# Patient Record
Sex: Female | Born: 2000 | Race: White | Hispanic: No | Marital: Single | Smoking: Never smoker
Health system: Southern US, Community
[De-identification: ages and names within clinical notes are randomized; demographics above are authoritative.]

## PROBLEM LIST (undated history)

## (undated) DIAGNOSIS — J45909 Unspecified asthma, uncomplicated: Secondary | ICD-10-CM

## (undated) DIAGNOSIS — F419 Anxiety disorder, unspecified: Secondary | ICD-10-CM

## (undated) DIAGNOSIS — R011 Cardiac murmur, unspecified: Secondary | ICD-10-CM

## (undated) HISTORY — DX: Unspecified asthma, uncomplicated: J45.909

## (undated) HISTORY — DX: Cardiac murmur, unspecified: R01.1

## (undated) HISTORY — DX: Anxiety disorder, unspecified: F41.9

---
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
## 2018-10-13 ENCOUNTER — Other Ambulatory Visit: Payer: Self-pay | Admitting: Sports Medicine

## 2018-10-13 ENCOUNTER — Ambulatory Visit: Payer: Managed Care, Other (non HMO) | Admitting: Sports Medicine

## 2018-10-13 DIAGNOSIS — R21 Rash and other nonspecific skin eruption: Secondary | ICD-10-CM | POA: Insufficient documentation

## 2018-10-13 DIAGNOSIS — Z0000 Encounter for general adult medical examination without abnormal findings: Secondary | ICD-10-CM | POA: Insufficient documentation

## 2018-10-13 DIAGNOSIS — Z23 Encounter for immunization: Secondary | ICD-10-CM | POA: Diagnosis not present

## 2018-10-13 MED ORDER — TRI-LO-MARZIA 0.18/0.215/0.25 MG-25 MCG PO TABS: 1.0000 | ORAL_TABLET | Freq: Every day | ORAL | 4 refills | Status: DC

## 2018-10-13 MED ORDER — TRIAMCINOLONE ACETONIDE 0.05 % EX OINT: 1.0000 "application " | TOPICAL_OINTMENT | Freq: Two times a day (BID) | CUTANEOUS | 0 refills | Status: DC

## 2018-10-13 MED ORDER — TRI-LO-MARZIA 0.18/0.215/0.25 MG-25 MCG PO TABS: 1.0000 | ORAL_TABLET | Freq: Every day | ORAL | 11 refills | Status: DC

## 2018-10-13 NOTE — Assessment & Plan Note (Signed)
New patient physical. Flu shot, meningococcal B. Return in greater than 30 days for vaccine #2 for meningococcal B. Checking routine labs.

## 2018-10-13 NOTE — Assessment & Plan Note (Signed)
Mild acne vulgaris, we can try BenzaClin for this in the future should she desire. Principal issue is a lichenified rash over the right side of the face, adding a low potency triamcinolone.

## 2018-10-13 NOTE — Progress Notes (Signed)
Subjective:    CC: New patient visit with the below complaints as noted in HPI:  HPI:  This is a pleasant 17 year old female, she is transferring over from her pediatrician.  She is healthy, she did have some anxiety growing up but symptoms are well controlled without medications.  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  More importantly to her though, she has developed a rash over the right side of her mouth, no changes in creams, foods, no particulate instigating factors.  Rash is only minimally itchy.  Otherwise has no complaints.  I reviewed the past medical history, family history, social history, surgical history, and allergies today and no changes were needed.  Please see the problem list section below in epic for further details.  Past Medical History: Past Medical History:  Diagnosis Date  . Anxiety   . Asthma   . Heart murmur    Past Surgical History: No past surgical history on file. Social History: Social History   Socioeconomic History  . Marital status: Single    Spouse name: Not on file  . Number of children: Not on file  . Years of education: Not on file  . Highest education level: Not on file  Occupational History  . Not on file  Social Needs  . Financial resource strain: Not on file  . Food insecurity:    Worry: Not on file    Inability: Not on file  . Transportation needs:    Medical: Not on file    Non-medical: Not on file  Tobacco Use  . Smoking status: Never Smoker  . Smokeless tobacco: Never Used  Substance and Sexual Activity  . Alcohol use: No  . Drug use: No  . Sexual activity: Never  Lifestyle  . Physical activity:    Days per week: Not on file    Minutes per session: Not on file  . Stress: Not on file  Relationships  . Social connections:    Talks on phone: Not on file    Gets together: Not on file    Attends religious service: Not on file    Active member of club or  organization: Not on file    Attends meetings of clubs or organizations: Not on file    Relationship status: Not on file  Other Topics Concern  . Not on file  Social History Narrative  . Not on file   Family History: Family History  Problem Relation Age of Onset  . Breast cancer Other   . Diabetes Paternal Uncle   . Depression Paternal Uncle   . Cancer Maternal Grandmother   . Mitral valve prolapse Maternal Grandmother   . Diabetes Maternal Grandmother   . Heart disease Maternal Grandfather   . Diabetes Paternal Grandmother   . Mitral valve prolapse Father   . Bipolar disorder Father   . Depression Mother   . Febrile seizures Brother        Resolved  . Migraines Maternal Aunt   . Breast cancer Paternal Aunt   . Seizures Paternal Aunt   . Migraines Cousin    Allergies: Allergies  Allergen Reactions  . Other Anaphylaxis    Tree Nuts- Anaphylaxis Seasonal Allergies    Medications: See med rec.  Review of Systems: No headache, visual changes, nausea, vomiting, diarrhea, constipation, dizziness, abdominal pain, skin rash, fevers, chills, night sweats, swollen lymph nodes, weight loss, chest pain, body aches, joint swelling, muscle aches, shortness  of breath, mood changes, visual or auditory hallucinations.  Objective:    General: Well Developed, well nourished, and in no acute distress.  Neuro: Alert and oriented x3, extra-ocular muscles intact, sensation grossly intact. Cranial nerves II through XII are intact, motor, sensory, and coordinative functions are all intact. HEENT: Normocephalic, atraumatic, pupils equal round reactive to light, neck supple, no masses, no lymphadenopathy, thyroid nonpalpable. Oropharynx, nasopharynx, external ear canals are unremarkable. Skin: Warm and dry, there is a slightly reddish lichenified rash just outside the lips on the right side of the mouth.  Mild papular acne vulgaris on the cheeks and forehead. Cardiac: Regular rate and rhythm, no  murmurs rubs or gallops.  Respiratory: Clear to auscultation bilaterally. Not using accessory muscles, speaking in full sentences.  Abdominal: Soft, nontender, nondistended, positive bowel sounds, no masses, no organomegaly.  Musculoskeletal: Shoulder, elbow, wrist, hip, knee, ankle stable, and with full range of motion.  Impression and Recommendations:    The patient was counselled, risk factors were discussed, anticipatory guidance given.  Annual physical exam New patient physical. Flu shot, meningococcal B. Return in greater than 30 days for vaccine #2 for meningococcal B. Checking routine labs.   Skin rash Mild acne vulgaris, we can try BenzaClin for this in the future should she desire. Principal issue is a lichenified rash over the right side of the face, adding a low potency triamcinolone. __________________________________________ Ihor Austin. Benjamin Stain, M.D., ABFM., CAQSM. Primary Care and Sports Medicine [facility]  Adjunct Professor of Family Medicine  [employer]

## 2018-10-14 ENCOUNTER — Telehealth: Payer: Self-pay

## 2018-10-14 MED ORDER — TRIAMCINOLONE ACETONIDE 0.025 % EX OINT: 1.0000 "application " | TOPICAL_OINTMENT | Freq: Two times a day (BID) | CUTANEOUS | 0 refills | Status: DC

## 2018-10-14 NOTE — Telephone Encounter (Signed)
Done

## 2018-10-14 NOTE — Telephone Encounter (Signed)
[facility] called and states the Ointment only comes in the Brand name and the cost is over $1000. They did suggest Triamcinolone 0.025 %. Please advise.

## 2018-10-15 LAB — COMPREHENSIVE METABOLIC PANEL
AG Ratio: 1.6 (calc) (ref 1.0–2.5)
ALT: 14 U/L (ref 8–46)
AST: 15 U/L (ref 12–32)
Albumin: 4.1 g/dL (ref 3.6–5.1)
BUN: 12 mg/dL (ref 7–20)
Creat: 0.82 mg/dL (ref 0.60–1.26)
Glucose, Bld: 85 mg/dL (ref 65–99)
Potassium: 4.3 mmol/L (ref 3.8–5.1)
Sodium: 142 mmol/L (ref 135–146)
Total Bilirubin: 0.4 mg/dL (ref 0.2–1.1)
Total Protein: 6.6 g/dL (ref 6.3–8.2)

## 2018-10-15 LAB — VITAMIN D 25 HYDROXY (VIT D DEFICIENCY, FRACTURES): Vit D, 25-Hydroxy: 42 ng/mL (ref 30–100)

## 2018-10-15 LAB — TSH: TSH: 1.83 m[IU]/L (ref 0.50–4.30)

## 2018-10-15 LAB — LIPID PANEL W/REFLEX DIRECT LDL
Cholesterol: 186 mg/dL — ABNORMAL HIGH (ref <170)
HDL: 70 mg/dL (ref >45)
LDL Cholesterol (Calc): 99 mg/dL (calc) (ref <110)
Non-HDL Cholesterol (Calc): 116 mg/dL (ref <120)
Total CHOL/HDL Ratio: 2.7 (calc) (ref <5.0)
Triglycerides: 81 mg/dL (ref <90)

## 2018-10-15 LAB — CBC
HCT: 36.4 % (ref 36.0–49.0)
Hemoglobin: 12.4 g/dL (ref 12.0–16.9)
MCH: 28.8 pg (ref 25.0–35.0)
MCHC: 34.1 g/dL (ref 31.0–36.0)
MCV: 84.5 fL (ref 78.0–98.0)
MPV: 10.3 fL (ref 7.5–12.5)
Platelets: 325 10*3/uL (ref 140–400)
RBC: 4.31 Million/uL (ref 4.10–5.70)
RDW: 12.2 % (ref 11.0–15.0)
WBC: 9.7 10*3/uL (ref 4.5–13.0)

## 2018-10-15 LAB — COMPREHENSIVE METABOLIC PANEL WITH GFR
Alkaline phosphatase (APISO): 43 U/L — ABNORMAL LOW (ref 48–230)
CO2: 28 mmol/L (ref 20–32)
Calcium: 9.3 mg/dL (ref 8.9–10.4)
Chloride: 105 mmol/L (ref 98–110)
Globulin: 2.5 g/dL (ref 2.1–3.5)

## 2018-10-15 LAB — HEMOGLOBIN A1C
Hgb A1c MFr Bld: 5.1 %{Hb} (ref <5.7)
Mean Plasma Glucose: 100 (calc)
eAG (mmol/L): 5.5 (calc)

## 2018-10-15 LAB — HIV ANTIBODY (ROUTINE TESTING W REFLEX): HIV 1&2 Ab, 4th Generation: NONREACTIVE

## 2018-11-23 ENCOUNTER — Ambulatory Visit: Payer: Managed Care, Other (non HMO) | Admitting: Family Medicine

## 2018-11-23 VITALS — BP 118/71 | HR 65 | Ht 64.0 in | Wt 131.0 lb

## 2018-11-23 DIAGNOSIS — M79672 Pain in left foot: Secondary | ICD-10-CM | POA: Diagnosis not present

## 2018-11-23 DIAGNOSIS — Z23 Encounter for immunization: Secondary | ICD-10-CM

## 2018-11-23 MED ORDER — DICLOFENAC SODIUM 1 % TD GEL: 2.0000 g | Freq: Four times a day (QID) | TRANSDERMAL | 11 refills | Status: DC

## 2018-11-23 NOTE — Progress Notes (Signed)
Brittany Sheppard is a 17 y.o. female who presents to [facility] today for left lateral foot pain.  Patient notes a 6 to 7-day history of left lateral foot pain.  Pain is located at the proximal fifth metatarsal into the cuboid and forward part of the calcaneus.  She denies any injury or significant change in activity.  She notes contralateral right sided symptoms are also present but very mild.  She is not really tried much treatment yet.  No radiating pain weakness or numbness fevers or chills.  She feels well otherwise.  She notes she goes to school at [employer] and notes that she has to do lots of walking but that has not changed recently.    ROS:  As above  Exam:  BP 118/71   Pulse 65   Ht 5\' 4"  (1.626 m)   Wt 131 lb (59.4 kg)   BMI 22.49 kg/m  General: Well Developed, well nourished, and in no acute distress.  Neuro/Psych: Alert and oriented x3, extra-ocular muscles intact, able to move all 4 extremities, sensation grossly intact. Skin: Warm and dry, no rashes noted.  Respiratory: Not using accessory muscles, speaking in full sentences, trachea midline.  Cardiovascular: Pulses palpable, no extremity edema. Abdomen: Does not appear distended. MSK: Left foot relatively normal-appearing with no deformity. Normal foot and ankle motion. Minimally tender to palpation proximal fifth metatarsal and into the peroneal tendon area. Strength is intact.  Resisted foot eversion minimally tender and painful.  Left foot normal.  No deformity. Normal foot and ankle motion. Nontender. Normal strength.  Pulses capillary fill and sensation are intact bilateral lower extremities.       Assessment and Plan: 17 y.o. female with lateral foot pain.  Unclear etiology.  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  Plan for diclofenac gel and eccentric exercises.  Also recommend compressive sleeve.  If not improving recheck neck  step likely would be x-ray and potential MRI.  Additionally patient due for second in a series of 2 meningitis B vaccines.  This was given today.    Orders Placed This Encounter  Procedures  . Meningococcal B, OMV (Bexsero)   Meds ordered this encounter  Medications  . diclofenac sodium (VOLTAREN) 1 % GEL    Sig: Apply 2 g topically 4 (four) times daily. To affected joint.    Dispense:  100 g    Refill:  11    Historical information moved to improve visibility of documentation.  Past Medical History:  Diagnosis Date  . Anxiety   . Asthma   . Heart murmur    No past surgical history on file. Social History   Tobacco Use  . Smoking status: Never Smoker  . Smokeless tobacco: Never Used  Substance Use Topics  . Alcohol use: No   family history includes Bipolar disorder in her father; Breast cancer in her other and paternal aunt; Cancer in her maternal grandmother; Depression in her mother and paternal uncle; Diabetes in her maternal grandmother, paternal grandmother, and paternal uncle; Febrile seizures in her brother; Heart disease in her maternal grandfather; Migraines in her cousin and maternal aunt; Mitral valve prolapse in her father and maternal grandmother; Seizures in her paternal aunt.  Medications: Current Outpatient Medications  Medication Sig Dispense Refill  . TRI-LO-MARZIA 0.18/0.215/0.25 MG-25 MCG tab Take 1 tablet by mouth daily. 3 Package 4  . triamcinolone (KENALOG) 0.025 % ointment Apply 1 application topically 2 (two) times  daily. 30 g 0  . diclofenac sodium (VOLTAREN) 1 % GEL Apply 2 g topically 4 (four) times daily. To affected joint. 100 g 11   No current facility-administered medications for this visit.    Allergies  Allergen Reactions  . Other Anaphylaxis    Tree Nuts- Anaphylaxis Seasonal Allergies       Discussed warning signs or symptoms. Please see discharge instructions. Patient expresses understanding.

## 2018-11-23 NOTE — Patient Instructions (Signed)
Thank you for coming in today. Apply the diclofenac gel to the lateral foot up to 4x daily as needed for pain.  Do the band exercises about 30 reps 2x daily.  Consider compression sleeve.  Body helix Full Ankle.   Recheck if not improving.    Peroneal Tendinopathy Rehab Ask your health care provider which exercises are safe for you. Do exercises exactly as told by your health care provider and adjust them as directed. It is normal to feel mild stretching, pulling, tightness, or discomfort as you do these exercises, but you should stop right away if you feel sudden pain or your pain gets worse.Do not begin these exercises until told by your health care provider. Stretching and range of motion exercises These exercises warm up your muscles and joints and improve the movement and flexibility of your ankle. These exercises also help to relieve pain and stiffness. Exercise A: Gastroc and soleus, standing 1. Stand on the edge of a step on the balls of your feet. The ball of your foot is on the walking surface, right under your toes. 2. Hold onto the railing for balance. 3. Slowly lift your left / right foot, allowing your body weight to press your left / right heel down over the edge of the step. You should feel a stretch in your left / right calf. 4. Hold this position for __________ seconds. Repeat __________ times with your left / right knee straight and __________ times with your left / right knee bent. Complete this stretch __________ times per day. Strengthening exercises These exercises improve the strength and endurance of your foot and ankle. Endurance is the ability to use your muscles for a long time, even after they get tired. Exercise B: Dorsiflexors  1. Secure a rubber exercise band or tube to an object, like a table leg, that will not move if it is pulled on. 2. Secure the other end of the band around your left / right foot. 3. Sit on the floor, facing the object with your left /  right foot extended. The band or tube should be slightly tense when your foot is relaxed. 4. Slowly flex your left / right ankle and toes to bring your foot toward you. 5. Hold this position for __________ seconds. 6. Slowly return your foot to the starting position. Repeat __________ times. Complete this exercise __________ times per day. Exercise C: Evertors 1. Sit on the floor with your legs straight out in front of you. 2. Loop a rubber exercise or band or tube around the ball of your left / right foot. The ball of your foot is on the walking surface, right under your toes. 3. Hold the ends of the band in your hands, or secure the band to a stable object. 4. Slowly push your foot outward, away from your other leg. 5. Hold this position for __________ seconds. 6. Slowly return your foot to the starting position. Repeat __________ times. Complete this exercise __________ times per day. Exercise D: Standing heel raise ( plantar flexion) 1. Stand with your feet shoulder-width apart with the balls of your feet on a step. The ball of your foot is on the walking surface, right under your toes. 2. Keep your weight spread evenly over the width of your feet while you rise up on your toes. Use a wall or railing to steady yourself, but try not to use it for support. 3. If this exercise is too easy, try these options: ? Shift your  weight toward your left / right leg until you feel challenged. ? If told by your health care provider, stand on your left / right leg only. 4. Hold this position for __________ seconds. Repeat __________ times. Complete this exercise __________ times per day. Exercise E: Single leg stand 1. Without shoes, stand near a railing or in a doorway. You may hold onto the railing or door frame as needed. 2. Stand on your left / right foot. Keep your big toe down on the floor and try to keep your arch lifted. ? Do not roll to the outside of your foot. ? If this exercise is too  easy, you can try it with your eyes closed or while standing on a pillow. 3. Hold this position for __________ seconds. Repeat __________ times. Complete this exercise __________ times per day. This information is not intended to replace advice given to you by your health care provider. Make sure you discuss any questions you have with your health care provider. Document Released: 12/14/2005 Document Revised: 08/20/2016 Document Reviewed: 11/02/2015 Elsevier Interactive Patient Education  [employer].

## 2019-07-10 ENCOUNTER — Telehealth: Payer: Self-pay | Admitting: *Deleted

## 2019-07-10 MED ORDER — EPINEPHRINE 0.3 MG/0.3ML IJ SOAJ: 0.3000 mg | INTRAMUSCULAR | 0 refills | Status: AC | PRN

## 2019-07-10 NOTE — Telephone Encounter (Signed)
Yes that is fine, I sent it in.

## 2019-07-10 NOTE — Telephone Encounter (Signed)
Pt's mom left a vm wanting to know if you'd be willing to refill her epi-pen before she goes back to college in the fall.  I didn't see this on her med list and I was not able to reconcile it from an outside source.

## 2019-07-10 NOTE — Telephone Encounter (Signed)
Pt's mom notified of rx.

## 2019-12-12 ENCOUNTER — Other Ambulatory Visit: Payer: Self-pay | Admitting: Sports Medicine

## 2019-12-13 ENCOUNTER — Ambulatory Visit (INDEPENDENT_AMBULATORY_CARE_PROVIDER_SITE_OTHER): Payer: Managed Care, Other (non HMO) | Admitting: Sports Medicine

## 2019-12-13 DIAGNOSIS — Z793 Long term (current) use of hormonal contraceptives: Secondary | ICD-10-CM

## 2019-12-13 DIAGNOSIS — Z0000 Encounter for general adult medical examination without abnormal findings: Secondary | ICD-10-CM

## 2019-12-13 MED ORDER — TRI-LO-MARZIA 0.18/0.215/0.25 MG-25 MCG PO TABS: 1.0000 | ORAL_TABLET | Freq: Every day | ORAL | 4 refills | Status: DC

## 2019-12-13 NOTE — Assessment & Plan Note (Signed)
Everything is going well, refilling birth control, just got her flu shot in October, return to see me in 1 year for an in person physical exam.

## 2019-12-13 NOTE — Progress Notes (Signed)
Virtual Visit via WebEx/MyChart   I connected with  Brittany Sheppard  on 12/13/19 via WebEx/MyChart/Doximity Video and verified that I am speaking with the correct person using two identifiers.   I discussed the limitations, risks, security and privacy concerns of performing an evaluation and management service by WebEx/MyChart/Doximity Video, including the higher likelihood of inaccurate diagnosis and treatment, and the availability of in person appointments.  We also discussed the likely need of an additional face to face encounter for complete and high quality delivery of care.  I also discussed with the patient that there may be a patient responsible charge related to this service. The patient expressed understanding and wishes to proceed.  Provider location is either at home or medical facility. Patient location is at their home, different from provider location. People involved in care of the patient during this telehealth encounter were myself, my nurse/medical assistant, and my front office/scheduling team member.  Subjective:    CC: Refills  HPI: This is a pleasant 18 year old female, she is in college studying journalism and public policy, she is happy, she just needs refill on her birth control, last period 3 weeks ago.  I reviewed the past medical history, family history, social history, surgical history, and allergies today and no changes were needed.  Please see the problem list section below in epic for further details.  Past Medical History: Past Medical History:  Diagnosis Date  . Anxiety   . Asthma   . Heart murmur    Past Surgical History: No past surgical history on file. Social History: Social History   Socioeconomic History  . Marital status: Single    Spouse name: Not on file  . Number of children: Not on file  . Years of education: Not on file  . Highest education level: Not on file  Occupational History  . Not on file  Tobacco Use  . Smoking  status: Never Smoker  . Smokeless tobacco: Never Used  Substance and Sexual Activity  . Alcohol use: No  . Drug use: No  . Sexual activity: Never  Other Topics Concern  . Not on file  Social History Narrative  . Not on file   Social Determinants of Health   Financial Resource Strain:   . Difficulty of Paying Living Expenses: Not on file  Food Insecurity:   . Worried About Programme researcher, broadcasting/film/video in the Last Year: Not on file  . Ran Out of Food in the Last Year: Not on file  Transportation Needs:   . Lack of Transportation (Medical): Not on file  . Lack of Transportation (Non-Medical): Not on file  Physical Activity:   . Days of Exercise per Week: Not on file  . Minutes of Exercise per Session: Not on file  Stress:   . Feeling of Stress : Not on file  Social Connections:   . Frequency of Communication with Friends and Family: Not on file  . Frequency of Social Gatherings with Friends and Family: Not on file  . Attends Religious Services: Not on file  . Active Member of Clubs or Organizations: Not on file  . Attends Banker Meetings: Not on file  . Marital Status: Not on file   Family History: Family History  Problem Relation Age of Onset  . Breast cancer Other   . Diabetes Paternal Uncle   . Depression Paternal Uncle   . Cancer Maternal Grandmother   . Mitral valve prolapse Maternal Grandmother   . Diabetes  Maternal Grandmother   . Heart disease Maternal Grandfather   . Diabetes Paternal Grandmother   . Mitral valve prolapse Father   . Bipolar disorder Father   . Depression Mother   . Febrile seizures Brother        Resolved  . Migraines Maternal Aunt   . Breast cancer Paternal Aunt   . Seizures Paternal Aunt   . Migraines Cousin    Allergies: Allergies  Allergen Reactions  . Other Anaphylaxis    Tree Nuts- Anaphylaxis Seasonal Allergies    Medications: See med rec.  Review of Systems: No fevers, chills, night sweats, weight loss, chest pain,  or shortness of breath.   Objective:    General: Speaking full sentences, no audible heavy breathing.  Sounds alert and appropriately interactive.  Appears well.  Face symmetric.  Extraocular movements intact.  Pupils equal and round.  No nasal flaring or accessory muscle use visualized.  No other physical exam performed due to the non-physical nature of this visit.  Impression and Recommendations:    Annual physical exam Everything is going well, refilling birth control, just got her flu shot in October, return to see me in 1 year for an in person physical exam.  I discussed the above assessment and treatment plan with the patient. The patient was provided an opportunity to ask questions and all were answered. The patient agreed with the plan and demonstrated an understanding of the instructions.   The patient was advised to call back or seek an in-person evaluation if the symptoms worsen or if the condition fails to improve as anticipated.   I provided 25 minutes of non-face-to-face time during this encounter, 15 minutes of additional time was needed to gather information, review chart, records, communicate/coordinate with staff remotely, troubleshooting the multiple errors that we get every time when trying to do video calls through the electronic medical record, WebEx, and Doximity, restart the encounter multiple times due to instability of the software, as well as complete documentation.   ___________________________________________ Gwen Her. Dianah Field, M.D., ABFM., CAQSM. Primary Care and Sports Medicine  [facility]  Adjunct Professor of [employer] [place] [employer] [employer]

## 2021-02-09 ENCOUNTER — Other Ambulatory Visit: Payer: Self-pay | Admitting: Sports Medicine

## 2021-02-14 ENCOUNTER — Other Ambulatory Visit: Payer: Self-pay

## 2021-02-14 MED ORDER — TRI-LO-MARZIA 0.18/0.215/0.25 MG-25 MCG PO TABS: 1.0000 | ORAL_TABLET | Freq: Every day | ORAL | 0 refills | Status: AC

## 2021-02-14 NOTE — Telephone Encounter (Signed)
Patient aware prescription was sent to pharmacy.  

## 2021-02-14 NOTE — Telephone Encounter (Signed)
Due to insurance changes, patient is having to change providers and has an appt set up with new provider. She is going to run out of medication on Sunday. She is requesting a one time fill to get her through til she can see the new provider. Prescription pended.

## 2021-07-21 ENCOUNTER — Emergency Department: Admit: 2021-07-21 | Discharge status: Absent without leave | Payer: Self-pay

## 2021-07-21 ENCOUNTER — Other Ambulatory Visit: Payer: Self-pay

## 2021-07-21 ENCOUNTER — Emergency Department
Admission: EM | Admit: 2021-07-21 | Discharge: 2021-07-21 | Disposition: A | Discharge status: Home / Self Care | Payer: 59 | Source: Home / Self Care

## 2021-07-21 ENCOUNTER — Emergency Department
Admit: 2021-07-21 | Discharge: 2021-07-21 | Disposition: A | Discharge status: Home / Self Care | Payer: 59 | Attending: Family Medicine | Admitting: Family Medicine

## 2021-07-21 ENCOUNTER — Telehealth: Payer: Self-pay

## 2021-07-21 DIAGNOSIS — M25571 Pain in right ankle and joints of right foot: Secondary | ICD-10-CM | POA: Diagnosis not present

## 2021-07-21 DIAGNOSIS — S93401A Sprain of unspecified ligament of right ankle, initial encounter: Secondary | ICD-10-CM

## 2021-07-21 NOTE — Telephone Encounter (Signed)
Call made to patient to give normal xray results. Discharge paperwork read to pt. Pt verbalized understanding. All questions answered. Call back if any questions.

## 2021-07-21 NOTE — ED Triage Notes (Signed)
Pt c/o RT ankle pain since yesterday when she rolled it while running. Elevated and iced. Motrin prn. Pain 5/10 Hurts to walk.

## 2021-07-21 NOTE — ED Provider Notes (Signed)
[facility]    CSN: [id number] Arrival date & time: 07/21/21  1412      History   Chief Complaint Chief Complaint  Patient presents with   Ankle Pain    RT    HPI Brittany Sheppard is a 20 y.o. female.   HPI 20 year old female presents with right ankle pain since yesterday, reports rolled ankle while running.  Currently reporting 5 out of 10 pain.  Patient reports while running Sunday afternoon around 3 PM she accidentally rolled her ankle inward.  Reports icing affected area of right ankle several times yesterday, Sunday, 07/20/2021 ibuprofen 800 mg 1-2 times daily.  Past Medical History:  Diagnosis Date   Anxiety    Asthma    Heart murmur     Patient Active Problem List   Diagnosis Date Noted   Annual physical exam 10/13/2018   Anxiety state, unspecified 04/23/2014    History reviewed. No pertinent surgical history.  OB History   No obstetric history on file.      Home Medications    Prior to Admission medications   Medication Sig Start Date End Date Taking? Authorizing Provider  dicyclomine (BENTYL) 20 MG tablet Take by mouth. 08/15/20  Yes [provider]  EPINEPHrine 0.3 mg/0.3 mL IJ SOAJ injection Inject 0.3 mLs (0.3 mg total) into the muscle as needed for anaphylaxis. Need to proceed to hospital if used. 07/10/19   Monica Becton, MD  TRI-LO-MARZIA 0.18/0.215/0.25 MG-25 MCG tab Take 1 tablet by mouth daily. 02/14/21   Monica Becton, MD    Family History Family History  Problem Relation Age of Onset   Breast cancer Other    Diabetes Paternal Uncle    Depression Paternal Uncle    Cancer Maternal Grandmother    Mitral valve prolapse Maternal Grandmother    Diabetes Maternal Grandmother    Heart disease Maternal Grandfather    Diabetes Paternal Grandmother    Mitral valve prolapse Father    Bipolar disorder Father    Depression Mother    Febrile seizures Brother        Resolved   Migraines Maternal Aunt     Breast cancer Paternal Aunt    Seizures Paternal Aunt    Migraines Cousin     Social History Social History   Tobacco Use   Smoking status: Never   Smokeless tobacco: Never  Substance Use Topics   Alcohol use: No   Drug use: No     Allergies   Other and Sulfa antibiotics   Review of Systems Review of Systems  Musculoskeletal:        Right ankle pain x 2 days    Physical Exam Triage Vital Signs ED Triage Vitals  Enc Vitals Group     BP 07/21/21 1441 119/79     Pulse Rate 07/21/21 1441 63     Resp 07/21/21 1441 18     Temp 07/21/21 1441 99 F (37.2 C)     Temp Source 07/21/21 1441 Oral     SpO2 07/21/21 1441 97 %     Weight --      Height --      Head Circumference --      Peak Flow --      Pain Score 07/21/21 1442 5     Pain Loc --      Pain Edu? --      Excl. in GC? --    No data found.  Updated Vital Signs BP  119/79 (BP Location: Left Arm)   Pulse 63   Temp 99 F (37.2 C) (Oral)   Resp 18   LMP 07/07/2021 (Approximate)   SpO2 97%   Physical Exam Vitals and nursing note reviewed.  Constitutional:      General: She is not in acute distress.    Appearance: Normal appearance. She is normal weight. She is not ill-appearing.  HENT:     Head: Normocephalic and atraumatic.     Mouth/Throat:     Mouth: Mucous membranes are moist.     Pharynx: Oropharynx is clear.  Eyes:     Extraocular Movements: Extraocular movements intact.     Conjunctiva/sclera: Conjunctivae normal.     Pupils: Pupils are equal, round, and reactive to light.  Cardiovascular:     Rate and Rhythm: Normal rate and regular rhythm.     Pulses: Normal pulses.     Heart sounds: Normal heart sounds.  Pulmonary:     Effort: Pulmonary effort is normal.     Breath sounds: Normal breath sounds. No wheezing, rhonchi or rales.  Musculoskeletal:     Cervical back: Normal range of motion and neck supple. No tenderness.     Comments: Right ankle: LROM with dorsiflexion/plantar flexion, mild  pain elicited with minimal varus/valgus manipulation, mild soft tissue swelling over medial malleolus.  Lymphadenopathy:     Cervical: No cervical adenopathy.  Skin:    General: Skin is warm and dry.  Neurological:     General: No focal deficit present.     Mental Status: She is alert and oriented to person, place, and time.  Psychiatric:        Mood and Affect: Mood normal.        Behavior: Behavior normal.        Thought Content: Thought content normal.     UC Treatments / Results  Labs (all labs ordered are listed, but only abnormal results are displayed) Labs Reviewed - No data to display  EKG   Radiology DG Ankle Complete Right  Result Date: 07/21/2021 CLINICAL DATA:  Rolled ankle, pain and swelling EXAM: RIGHT ANKLE - COMPLETE 3+ VIEW COMPARISON:  None. FINDINGS: There is no evidence of fracture, dislocation, or joint effusion. There is no evidence of arthropathy or other focal bone abnormality. Soft tissue edema about the medial and lateral ankle. IMPRESSION: No fracture or dislocation of the right ankle. Soft tissue edema about the medial and lateral ankle. Electronically Signed   By: Lauralyn Primes M.D.   On: 07/21/2021 16:13    Procedures Procedures (including critical care time)  Medications Ordered in UC Medications - No data to display  Initial Impression / Assessment and Plan / UC Course  I have reviewed the triage vital signs and the nursing notes.  Pertinent labs & imaging results that were available during my care of the patient were reviewed by me and considered in my medical decision making (see chart for details).    MDM: 1.  Right ankle pain-advised may use OTC ibuprofen 800 mg 1-2 times daily, as needed, 2.  Right ankle sprain-advised patient may RICE right ankle for 20 minutes 2-3 times daily for the next 3 days, advised patient to avoid moderate to strenuous activities involving right ankle for the next 7 to 10 days.  Patient discharged home,  hemodynamically stable. Final Clinical Impressions(s) / UC Diagnoses   Final diagnoses:  Acute right ankle pain  Sprain of right ankle, unspecified ligament, initial encounter  Discharge Instructions      Patient requested x-ray of the right ankle be performed outpatient at [facility].  Advised patient right ankle x-ray returned within normal limits.  Advised patient may use OTC ibuprofen 800 mg 1-2 times daily, as needed for the next 7 to 10 days.  Advised patient may RICE right ankle for 20 minutes 2-3 times daily for the next 3 days.  Advised/encourage patient to avoid moderate to strenuous activities involving right ankle for the next 7 to 10 days.     ED Prescriptions   None    PDMP not reviewed this encounter.   Trevor Iha, FNP 07/21/21 [phone]

## 2021-07-21 NOTE — Discharge Instructions (Addendum)
Patient requested x-ray of the right ankle be performed outpatient at [facility] imaging.  Advised patient right ankle x-ray returned within normal limits.  Advised patient may use OTC ibuprofen 800 mg 1-2 times daily, as needed for the next 7 to 10 days.  Advised patient may RICE right ankle for 20 minutes 2-3 times daily for the next 3 days.  Advised/encourage patient to avoid moderate to strenuous activities involving right ankle for the next 7 to 10 days.

## 2023-01-16 IMAGING — CR DG ANKLE COMPLETE 3+V*R*
3 series · 3 of 3 positions shown · non-contrast
Comparison: None.

CLINICAL DATA: Rolled ankle, pain and swelling

EXAM:
RIGHT ANKLE - COMPLETE 3+ VIEW

[x ankle ap right]
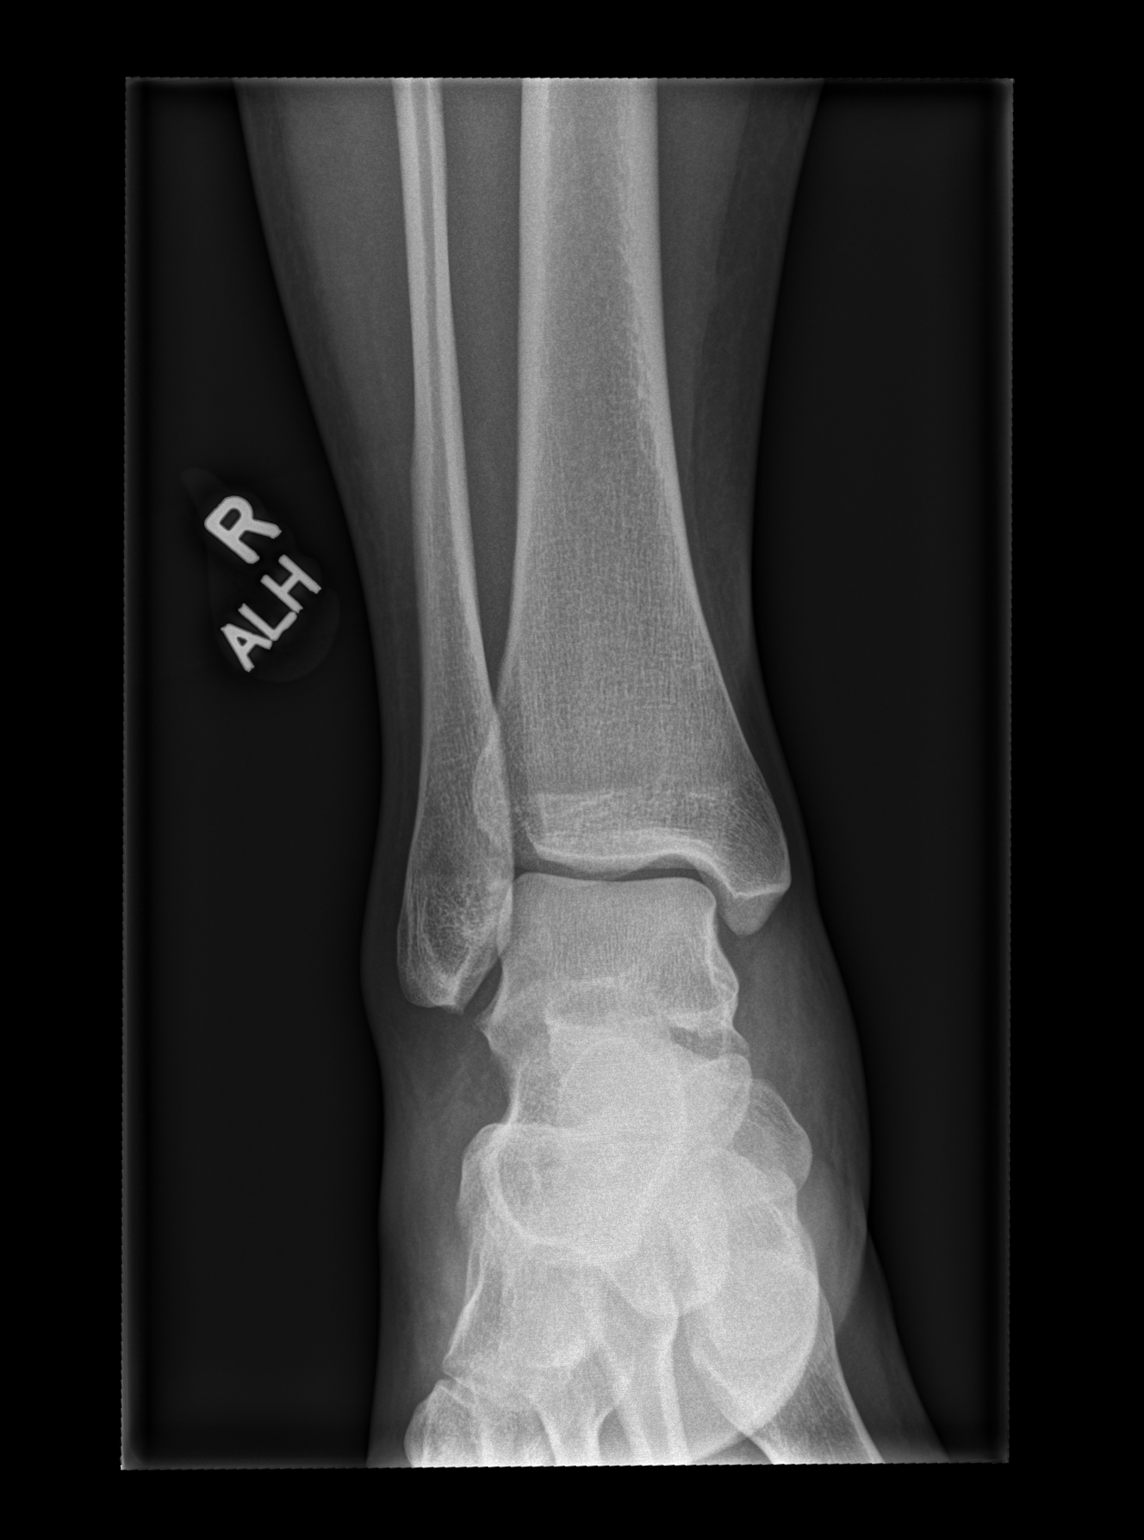

[x ankle obl right]
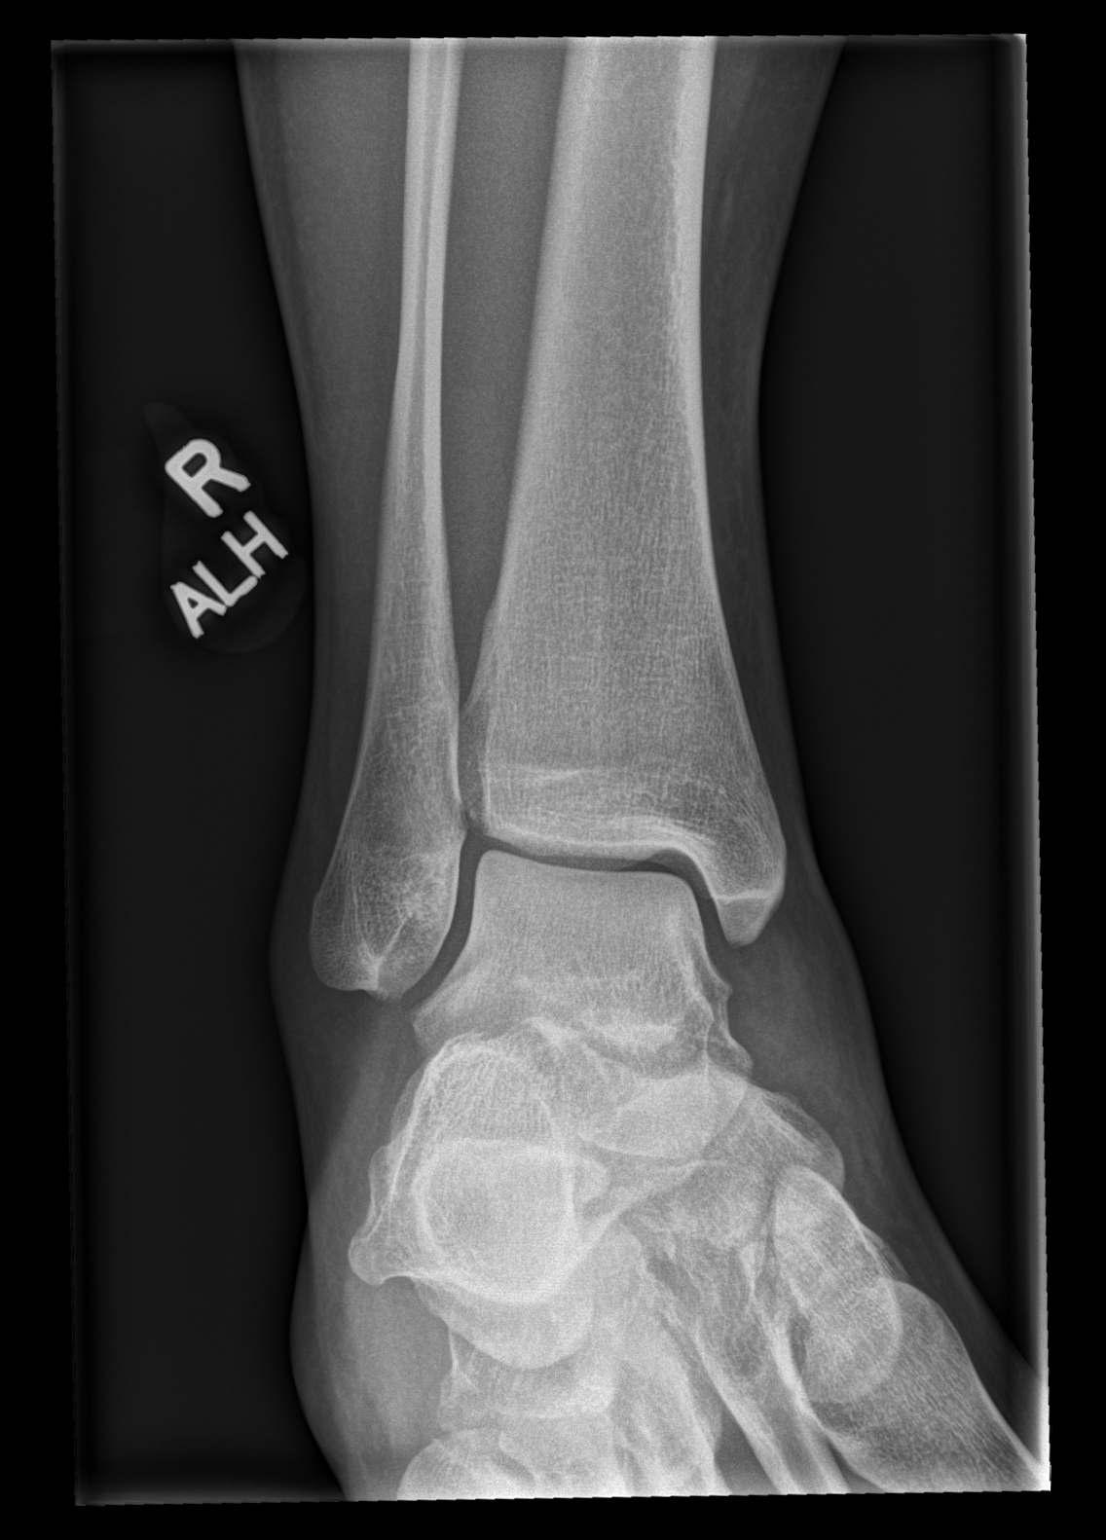

[x ankle lat right]
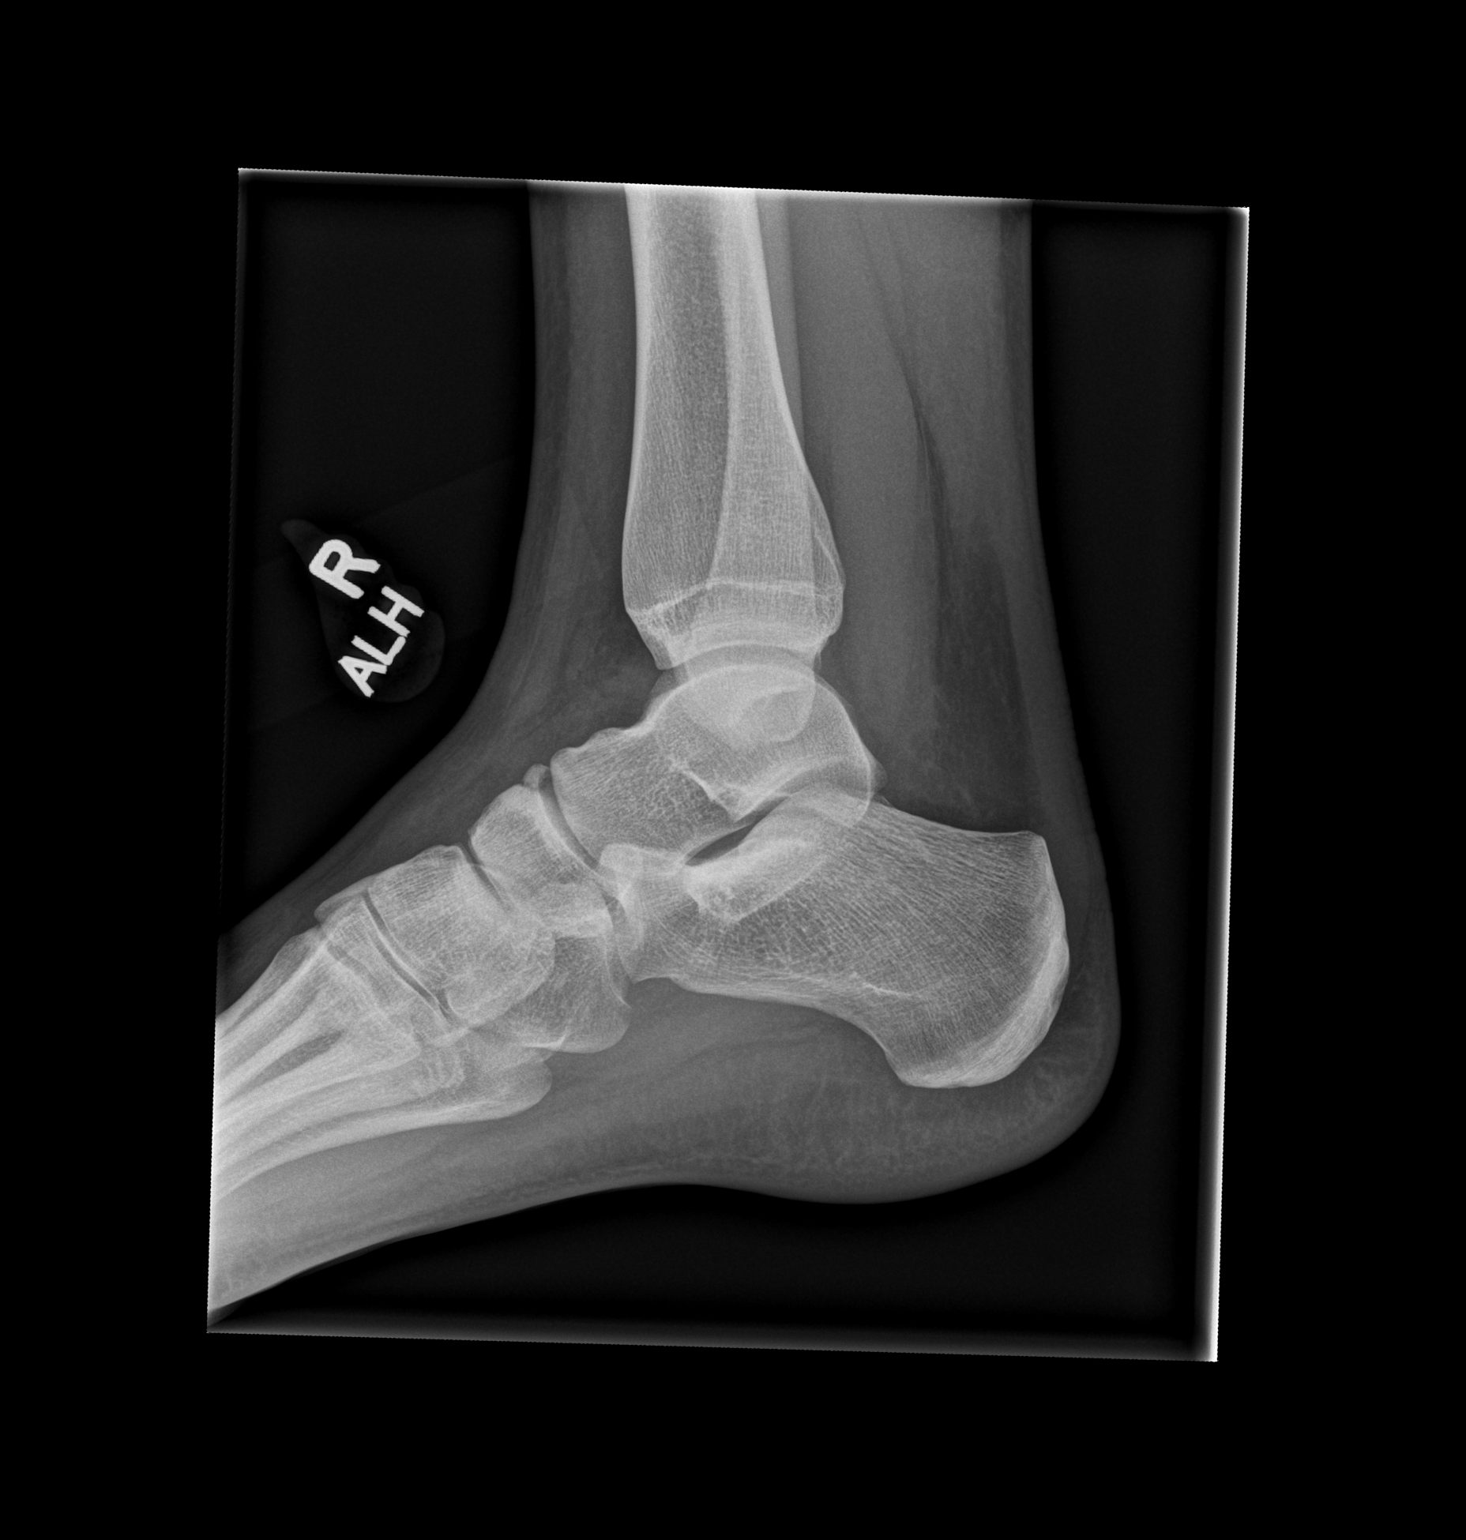

[3 of 3 positions shown; findings below may reference images not displayed]

FINDINGS: There is no evidence of fracture, dislocation, or joint effusion.
There is no evidence of arthropathy or other focal bone abnormality.
Soft tissue edema about the medial and lateral ankle.
IMPRESSION: No fracture or dislocation of the right ankle. Soft tissue edema
about the medial and lateral ankle.
